# Patient Record
Sex: Female | Born: 1975 | Marital: Married | Smoking: Never smoker
Health system: Southern US, Community
[De-identification: ages and names within clinical notes are randomized; demographics above are authoritative.]

## PROBLEM LIST (undated history)

## (undated) HISTORY — PX: OTHER SURGICAL HISTORY: SHX169

## (undated) HISTORY — PX: JOINT REPLACEMENT: SHX530

---

## 2016-12-31 DIAGNOSIS — Z8619 Personal history of other infectious and parasitic diseases: Secondary | ICD-10-CM | POA: Insufficient documentation

## 2018-10-13 DIAGNOSIS — F410 Panic disorder [episodic paroxysmal anxiety] without agoraphobia: Secondary | ICD-10-CM | POA: Insufficient documentation

## 2019-11-30 LAB — HM PAP SMEAR: HM Pap smear: NEGATIVE

## 2020-05-18 ENCOUNTER — Ambulatory Visit: Payer: Self-pay

## 2020-12-19 ENCOUNTER — Other Ambulatory Visit: Payer: Self-pay

## 2020-12-19 ENCOUNTER — Encounter: Payer: Self-pay | Admitting: Family Medicine

## 2020-12-19 ENCOUNTER — Ambulatory Visit (INDEPENDENT_AMBULATORY_CARE_PROVIDER_SITE_OTHER): Payer: 59

## 2020-12-19 ENCOUNTER — Ambulatory Visit: Payer: 59 | Admitting: Family Medicine

## 2020-12-19 VITALS — BP 119/73 | HR 83 | Temp 98.3°F | Ht 63.39 in | Wt 162.7 lb

## 2020-12-19 DIAGNOSIS — R0600 Dyspnea, unspecified: Secondary | ICD-10-CM

## 2020-12-19 DIAGNOSIS — R002 Palpitations: Secondary | ICD-10-CM

## 2020-12-19 NOTE — Patient Instructions (Addendum)
Great to meet you today! Have labs and chest xray completed.  We'll be in touch with results and recommendations from there.

## 2020-12-20 DIAGNOSIS — R002 Palpitations: Secondary | ICD-10-CM | POA: Insufficient documentation

## 2020-12-20 LAB — CBC
HCT: 42.1 % (ref 35.0–45.0)
Hemoglobin: 14.6 g/dL (ref 11.7–15.5)
MCH: 32.2 pg (ref 27.0–33.0)
MCHC: 34.7 g/dL (ref 32.0–36.0)
MCV: 92.7 fL (ref 80.0–100.0)
MPV: 10.9 fL (ref 7.5–12.5)
Platelets: 317 10*3/uL (ref 140–400)
RBC: 4.54 10*6/uL (ref 3.80–5.10)
RDW: 13.1 % (ref 11.0–15.0)
WBC: 8.7 10*3/uL (ref 3.8–10.8)

## 2020-12-20 LAB — COMPLETE METABOLIC PANEL WITH GFR
AG Ratio: 1.7 (calc) (ref 1.0–2.5)
ALT: 13 U/L (ref 6–29)
AST: 15 U/L (ref 10–35)
Albumin: 4.3 g/dL (ref 3.6–5.1)
Alkaline phosphatase (APISO): 56 U/L (ref 31–125)
BUN/Creatinine Ratio: 9 (calc) (ref 6–22)
BUN: 6 mg/dL — ABNORMAL LOW (ref 7–25)
CO2: 24 mmol/L (ref 20–32)
Calcium: 9.1 mg/dL (ref 8.6–10.2)
Chloride: 107 mmol/L (ref 98–110)
Creat: 0.68 mg/dL (ref 0.50–1.10)
GFR, Est African American: 122 mL/min/{1.73_m2} (ref >=60)
GFR, Est Non African American: 106 mL/min/{1.73_m2} (ref >=60)
Globulin: 2.5 g/dL (calc) (ref 1.9–3.7)
Glucose, Bld: 80 mg/dL (ref 65–99)
Potassium: 4 mmol/L (ref 3.5–5.3)
Sodium: 140 mmol/L (ref 135–146)
Total Bilirubin: 0.3 mg/dL (ref 0.2–1.2)
Total Protein: 6.8 g/dL (ref 6.1–8.1)

## 2020-12-20 LAB — TSH: TSH: 1.31 mIU/L

## 2020-12-20 NOTE — Assessment & Plan Note (Signed)
EKG normal.  Check CMP, CBC and TSH.  She has had some dyspnea as well.  CXR ordered.  Discussed that anxiety may be contributing which she realizes as well but wants to be sure there is no other serious underlying cause.  Can consider holter and/or echo if symptoms persist.

## 2020-12-20 NOTE — Progress Notes (Signed)
Michelle Chambers - 45 y.o. female MRN [id number]  Date of birth: [birth year]  Subjective Chief Complaint  Patient presents with  . Establish Care  . Chest Pain  . Tachycardia    HPI Michelle Chambers is a 45 y.o. female here today for initial visit to establish care.  She has been in good health but has concerns about episodes of palpitations with associated shortness of breath.  Symptoms seem to occur randomly.  These do worsen some with exertional activity.  She denies fever, chills, cough, wheezing, chest pain, dizziness.  She does admit to some mild anxiety but doesn't think that this is a major contributor to her symptoms.    ROS:  A comprehensive ROS was completed and negative except as noted per HPI  No Known Allergies  History reviewed. No pertinent past medical history.  Past Surgical History:  Procedure Laterality Date  . BROKEN BONE REPLACEMENT    . JOINT REPLACEMENT      Social History   Socioeconomic History  . Marital status: Married    Spouse name: Not on file  . Number of children: Not on file  . Years of education: Not on file  . Highest education level: Not on file  Occupational History  . Not on file  Tobacco Use  . Smoking status: Never Smoker  . Smokeless tobacco: Never Used  Vaping Use  . Vaping Use: Never used  Substance and Sexual Activity  . Alcohol use: Never  . Drug use: Never  . Sexual activity: Yes  Other Topics Concern  . Not on file  Social History Narrative  . Not on file   Social Determinants of Health   Financial Resource Strain: Not on file  Food Insecurity: Not on file  Transportation Needs: Not on file  Physical Activity: Not on file  Stress: Not on file  Social Connections: Not on file    Family History  Problem Relation Age of Onset  . Colon cancer Maternal Grandfather     Health Maintenance  Topic Date Due  . Hepatitis C Screening  Never done  . HIV Screening  Never done  . PAP SMEAR-Modifier  Never done  .  COLONOSCOPY (Pts 45-39yrs Insurance coverage will need to be confirmed)  Never done  . COVID-19 Vaccine (3 - Booster) 12/04/2020  . TETANUS/TDAP  10/29/2024  . INFLUENZA VACCINE  Discontinued     ----------------------------------------------------------------------------------------------------------------------------------------------------------------------------------------------------------------- Physical Exam BP 119/73 (BP Location: Left Arm, Patient Position: Sitting, Cuff Size: Normal)   Pulse 83   Temp 98.3 F (36.8 C)   Ht 5' 3.39" (1.61 m)   Wt 162 lb 11.2 oz (73.8 kg)   LMP 12/08/2020 (Exact Date)   SpO2 98%   BMI 28.47 kg/m   Physical Exam Constitutional:      Appearance: She is well-developed.  HENT:     Head: Normocephalic and atraumatic.  Eyes:     General: No scleral icterus. Cardiovascular:     Rate and Rhythm: Normal rate and regular rhythm.  Pulmonary:     Effort: Pulmonary effort is normal.     Breath sounds: Normal breath sounds.  Musculoskeletal:     Cervical back: Normal range of motion and neck supple.  Neurological:     General: No focal deficit present.     Mental Status: She is alert.  Psychiatric:        Mood and Affect: Mood normal.        Behavior: Behavior normal.    EKG:  NSR  with borderline PR interval.  QTc normal.  ------------------------------------------------------------------------------------------------------------------------------------------------------------------------------------------------------------------- Assessment and Plan  Palpitations EKG normal.  Check CMP, CBC and TSH.  She has had some dyspnea as well.  CXR ordered.  Discussed that anxiety may be contributing which she realizes as well but wants to be sure there is no other serious underlying cause.  Can consider holter and/or echo if symptoms persist.     No orders of the defined types were placed in this encounter.   No follow-ups on  file.    This visit occurred during the SARS-CoV-2 public health emergency.  Safety protocols were in place, including screening questions prior to the visit, additional usage of staff PPE, and extensive cleaning of exam room while observing appropriate contact time as indicated for disinfecting solutions.

## 2020-12-21 ENCOUNTER — Ambulatory Visit: Payer: Self-pay | Admitting: Medical-Surgical

## 2020-12-27 ENCOUNTER — Encounter: Payer: Self-pay | Admitting: Family Medicine

## 2021-04-03 ENCOUNTER — Ambulatory Visit (INDEPENDENT_AMBULATORY_CARE_PROVIDER_SITE_OTHER): Payer: 59

## 2021-04-03 ENCOUNTER — Encounter: Payer: Self-pay | Admitting: Family Medicine

## 2021-04-03 ENCOUNTER — Other Ambulatory Visit: Payer: Self-pay

## 2021-04-03 ENCOUNTER — Ambulatory Visit (INDEPENDENT_AMBULATORY_CARE_PROVIDER_SITE_OTHER): Payer: 59 | Admitting: Family Medicine

## 2021-04-03 VITALS — BP 128/83 | HR 88 | Temp 98.0°F | Ht 63.0 in | Wt 157.0 lb

## 2021-04-03 DIAGNOSIS — Z8619 Personal history of other infectious and parasitic diseases: Secondary | ICD-10-CM | POA: Diagnosis not present

## 2021-04-03 DIAGNOSIS — M79645 Pain in left finger(s): Secondary | ICD-10-CM

## 2021-04-03 DIAGNOSIS — F410 Panic disorder [episodic paroxysmal anxiety] without agoraphobia: Secondary | ICD-10-CM

## 2021-04-03 DIAGNOSIS — F411 Generalized anxiety disorder: Secondary | ICD-10-CM

## 2021-04-03 DIAGNOSIS — M79671 Pain in right foot: Secondary | ICD-10-CM | POA: Diagnosis not present

## 2021-04-03 MED ORDER — ESCITALOPRAM OXALATE 10 MG PO TABS: 10.0000 mg | ORAL_TABLET | Freq: Every day | ORAL | 3 refills | Status: AC

## 2021-04-03 MED ORDER — VALTREX 500 MG PO TABS: 500.0000 mg | ORAL_TABLET | Freq: Every day | ORAL | 2 refills | Status: DC

## 2021-04-03 MED ORDER — VALACYCLOVIR HCL 500 MG PO TABS: 500.0000 mg | ORAL_TABLET | Freq: Every day | ORAL | 2 refills | Status: DC

## 2021-04-03 NOTE — Assessment & Plan Note (Signed)
Starting lexapro 5mg  x 1 week with increase to 10mg  thereafter.  Recommend referral to therapist but would like to hold off on this for now.

## 2021-04-03 NOTE — Assessment & Plan Note (Signed)
Xray of fingers ordered.

## 2021-04-03 NOTE — Progress Notes (Signed)
Michelle Chambers - 45 y.o. female MRN [id number]  Date of birth: Nov 02, 1975  Subjective Chief Complaint  Patient presents with  . Anxiety  . Foot Injury  . Hand Pain    HPI Michelle Chambers is 45 y.o. female here today with complaint of anxiety, finger pain and foot issue.   Thinks she may have foreign body in her foot.  She has had tender, callused are along mid food.  Tender to pressure to this are.  She has had this for about 15 years.  Doesn't remember any specific foreign body.  She has had her husband try to "dig this out"  In the past but hasn't found anything.  She denies swelling, redness, or drainage.   Having pain in proximal phalanx of L hand.  No joint swelling or injury.  No redness or warmth.  She reports increased feeling of anxiety as well. She has taken lexapro in the past and feels like she did pretty well with this.  Would be interested in re-starting this.   Depression screen [facility] 2/9 04/03/2021 12/19/2020  Decreased Interest 0 0  Down, Depressed, Hopeless 0 0  PHQ - 2 Score 0 0  Altered sleeping 0 0  Tired, decreased energy 3 0  Change in appetite 0 0  Feeling bad or failure about yourself  0 0  Trouble concentrating 0 0  Moving slowly or fidgety/restless 0 0  Suicidal thoughts 0 0  PHQ-9 Score 3 0  Difficult doing work/chores Not difficult at all -   GAD 7 : Generalized Anxiety Score 04/03/2021 12/19/2020  Nervous, Anxious, on Edge 3 2  Control/stop worrying 1 2  Worry too much - different things 3 2  Trouble relaxing 0 2  Restless 0 2  Easily annoyed or irritable 0 2  Afraid - awful might happen 3 2  Total GAD 7 Score 10 14  Anxiety Difficulty Somewhat difficult Somewhat difficult   ROS:  A comprehensive ROS was completed and negative except as noted per HPI  No Known Allergies  History reviewed. No pertinent past medical history.  Past Surgical History:  Procedure Laterality Date  . BROKEN BONE REPLACEMENT    . JOINT REPLACEMENT      Social  History   Socioeconomic History  . Marital status: Married    Spouse name: Not on file  . Number of children: Not on file  . Years of education: Not on file  . Highest education level: Not on file  Occupational History  . Not on file  Tobacco Use  . Smoking status: Never Smoker  . Smokeless tobacco: Never Used  Vaping Use  . Vaping Use: Never used  Substance and Sexual Activity  . Alcohol use: Never  . Drug use: Never  . Sexual activity: Yes  Other Topics Concern  . Not on file  Social History Narrative  . Not on file   Social Determinants of Health   Financial Resource Strain: Not on file  Food Insecurity: Not on file  Transportation Needs: Not on file  Physical Activity: Not on file  Stress: Not on file  Social Connections: Not on file    Family History  Problem Relation Age of Onset  . Colon cancer Maternal Grandfather     Health Maintenance  Topic Date Due  . HIV Screening  Never done  . Hepatitis C Screening  Never done  . COLONOSCOPY (Pts 45-107yrs Insurance coverage will need to be confirmed)  Never done  . COVID-19 Vaccine (3 -  Booster) 11/03/2020  . PAP SMEAR-Modifier  11/29/2022  . TETANUS/TDAP  10/29/2024  . Zoster Vaccines- Shingrix (1 of 2) 02/15/2025  . Pneumococcal Vaccine 69-15 Years old  Aged Out  . HPV VACCINES  Aged Out  . INFLUENZA VACCINE  Discontinued     ----------------------------------------------------------------------------------------------------------------------------------------------------------------------------------------------------------------- Physical Exam BP 128/83 (BP Location: Left Arm, Patient Position: Sitting, Cuff Size: Normal)   Pulse 88   Temp 98 F (36.7 C)   Ht 5\' 3"  (1.6 m)   Wt 157 lb (71.2 kg)   LMP 03/17/2021   SpO2 96%   BMI 27.81 kg/m   Physical Exam Constitutional:      Appearance: Normal appearance.  Eyes:     General: No scleral icterus. Cardiovascular:     Rate and Rhythm: Normal rate  and regular rhythm.  Pulmonary:     Effort: Pulmonary effort is normal.     Breath sounds: Normal breath sounds.  Musculoskeletal:     Cervical back: Neck supple.     Comments: Pain along proximal phalanx of L hand without swelling or erythema.   Skin:    General: Skin is warm and dry.     Comments: Callused area along plantar aspect of R foot.  TTP with pressure.  No foreign body visualized on exam.    Neurological:     General: No focal deficit present.     Mental Status: She is alert.  Psychiatric:        Mood and Affect: Mood normal.        Behavior: Behavior normal.     ------------------------------------------------------------------------------------------------------------------------------------------------------------------------------------------------------------------- Assessment and Plan  Generalized anxiety disorder with panic attacks Starting lexapro 5mg  x 1 week with increase to 10mg  thereafter.  Recommend referral to therapist but would like to hold off on this for now.    History of herpes simplex infection Daily valtrex renewed.   Right foot pain Appears to be plantar wart.  Will obtain xray to evaluate for radiopaque foreign body but I think this cause is less likely.   Finger pain, left Xray of fingers ordered.    Meds ordered this encounter  Medications  . escitalopram (LEXAPRO) 10 MG tablet    Sig: Take 1 tablet (10 mg total) by mouth daily. Take 5mg  daily x1 week then increase to 10mg  daily    Dispense:  30 tablet    Refill:  3  . DISCONTD: valACYclovir (VALTREX) 500 MG tablet    Sig: Take 1 tablet (500 mg total) by mouth daily.    Dispense:  90 tablet    Refill:  2  . VALTREX 500 MG tablet    Sig: Take 1 tablet (500 mg total) by mouth daily. Replaces previous rx sent for valacyclovir.    Dispense:  90 tablet    Refill:  2    No follow-ups on file.    This visit occurred during the SARS-CoV-2 public health emergency.  Safety protocols  were in place, including screening questions prior to the visit, additional usage of staff PPE, and extensive cleaning of exam room while observing appropriate contact time as indicated for disinfecting solutions.

## 2021-04-03 NOTE — Assessment & Plan Note (Signed)
Appears to be plantar wart.  Will obtain xray to evaluate for radiopaque foreign body but I think this cause is less likely.

## 2021-04-03 NOTE — Assessment & Plan Note (Signed)
Daily valtrex renewed.

## 2021-04-03 NOTE — Patient Instructions (Signed)
Plantar Warts Plantar warts are small growths on the bottom of the foot (sole). Warts are caused by a type of germ (virus). Most warts are not painful, and they usually do not cause problems. Sometimes, plantar warts can cause pain when you walk. Warts often go away on their own in time. They can also spread to other areas of the body. Treatments may be done if needed. What are the causes?  Plantar warts are caused by a germ that is called human papillomavirus (HPV). ? Walking barefoot can cause exposure to the germ, especially if your feet are wet. ? Warts happen when HPV attacks a break in the skin of the foot. What increases the risk?  Being between 49-7 years of age.  Using public showers or locker rooms.  Having a weakened body defense system (immune system). What are the signs or symptoms?  Flat or slightly raised growths that have a rough surface and look like a callus.  Pain when you use your foot to support your body weight.   How is this treated? In many cases, warts do not need treatment. Without treatment, they often go away with time. If treatment is needed or wanted, options may include:  Applying medicated solutions, creams, or patches to the wart. These make the skin soft so that layers will slowly shed away.  Freezing the wart with liquid nitrogen (cryotherapy).  Burning the wart with: ? Laser treatment. ? An electrified probe (electrocautery).  Injecting a medicine (Candida antigen) into the wart to help the body's defense system fight off the wart.  Having surgery to remove the wart.  Putting duct tape over the top of the wart (occlusion). You will leave the tape in place for as long as told by your doctor. Then you will replace it with a new strip of tape. This is done until the wart goes away. Repeat treatment may be needed if you choose to remove warts. Warts sometimes go away and come back again. Follow these instructions at home: General  instructions  Apply creams or solutions only as told by your doctor. Follow these steps if your doctor tells you to do so: ? Soak your foot in warm water. ? Remove the top layer of softened skin before you apply the medicine. You can use a pumice stone to remove the skin. ? After you apply the medicine, put a bandage over the area of the wart. ? Repeat the process every day or as told by your doctor.  Do not scratch or pick at a wart.  Wash your hands after you touch a wart.  If a wart hurts, try covering it with a bandage that has a hole in the middle.  Keep all follow-up visits as told by your doctor. This is important. How is this prevented?  Wear shoes and socks. Change your socks every day.  Keep your feet clean and dry.  Check your feet often.  Do not walk barefoot in: ? Shared locker rooms. ? Shower areas. ? Swimming pools.  Avoid direct contact with warts on other people.   Contact a doctor if:  Your warts do not improve after treatment.  You have redness, swelling, or pain at the site of a wart.  You have bleeding from a wart, and the bleeding does not stop when you put light pressure on the wart.  You have diabetes and you get a wart. Summary  Warts are small growths on the skin.  When warts happen on the  bottom of the foot (sole), they are called plantar warts.  In many cases, warts do not need treatment.  Apply creams or solutions only as told by your doctor.  Do not scratch or pick at a wart. Wash your hands after you touch a wart. This information is not intended to replace advice given to you by your health care provider. Make sure you discuss any questions you have with your health care provider. Document Revised: 07/24/2018 Document Reviewed: 07/24/2018 Elsevier Patient Education  2021 [employer].

## 2021-04-04 ENCOUNTER — Other Ambulatory Visit: Payer: Self-pay | Admitting: Family Medicine

## 2021-04-04 MED ORDER — VALACYCLOVIR HCL 500 MG PO TABS: 500.0000 mg | ORAL_TABLET | Freq: Every day | ORAL | 2 refills | Status: AC

## 2021-04-12 ENCOUNTER — Other Ambulatory Visit: Payer: Self-pay | Admitting: Family Medicine

## 2021-04-12 DIAGNOSIS — R079 Chest pain, unspecified: Secondary | ICD-10-CM

## 2021-04-16 ENCOUNTER — Emergency Department: Admit: 2021-04-16 | Discharge status: Against Medical Advice | Payer: Self-pay

## 2021-04-17 ENCOUNTER — Other Ambulatory Visit: Payer: Self-pay | Admitting: Family Medicine

## 2021-04-17 ENCOUNTER — Telehealth: Payer: Self-pay

## 2021-04-17 DIAGNOSIS — R1319 Other dysphagia: Secondary | ICD-10-CM

## 2021-04-17 NOTE — Telephone Encounter (Signed)
Order entered

## 2021-04-17 NOTE — Telephone Encounter (Signed)
Pt has been advised of Urgent referral placement.

## 2021-04-17 NOTE — Telephone Encounter (Signed)
Pt lvm requesting Urgent Referral to [facility]. States she needs an EGD and throat stretching.   Dr. Ashley Royalty has been advised.

## 2021-04-18 ENCOUNTER — Telehealth: Payer: Self-pay | Admitting: General Practice

## 2021-04-18 ENCOUNTER — Telehealth: Payer: Self-pay | Admitting: Gastroenterology

## 2021-04-18 NOTE — Telephone Encounter (Signed)
Please go ahead and schedule in APP clinic RG

## 2021-04-18 NOTE — Telephone Encounter (Signed)
Transition Care Management Follow-up Telephone Call Date of discharge and from where: 04/17/21 from [facility] How have you been since you were released from the hospital? Doing ok. Any questions or concerns? No  Items Reviewed: Did the pt receive and understand the discharge instructions provided? Yes  Medications obtained and verified? Yes  Other? No  Any new allergies since your discharge? No  Dietary orders reviewed? Yes Do you have support at home? Yes   Home Care and Equipment/Supplies: Were home health services ordered? no   Functional Questionnaire: (I = Independent and D = Dependent) ADLs: I  Bathing/Dressing- I  Meal Prep- I  Eating- I  Maintaining continence- I  Transferring/Ambulation- I  Managing Meds- I  Follow up appointments reviewed:  PCP Hospital f/u appt confirmed? No   Specialist Hospital f/u appt confirmed? Yes  Patient has been referred to GI doctor. She stated that they have contacted her but she is waiting to hear back with an appointment.  Are transportation arrangements needed? No  If their condition worsens, is the pt aware to call PCP or go to the Emergency Dept.? Yes Was the patient provided with contact information for the PCP's office or ED? Yes Was to pt encouraged to call back with questions or concerns? Yes

## 2021-04-18 NOTE — Telephone Encounter (Signed)
Hey Dr Chales Abrahams, this pt is being referred to [place] from [facility] for Esophageal Dysphagia but it looks like the pt has been seen by Dr Donivan Scull [facility] this year 2022, records are in epic for review, please advise on scheduling.

## 2021-04-20 NOTE — Telephone Encounter (Signed)
Patient called to schedule with an APP for 04/26/21 appointment was no longer available offered her the next available but she declined and requested to speak with you.

## 2021-04-20 NOTE — Telephone Encounter (Signed)
Pls call pt, she declined first available appt stating that the person that she spoke with before told her that we will get her in within one week in a urgent appointment opening.

## 2021-05-08 ENCOUNTER — Ambulatory Visit: Payer: 59 | Admitting: Family Medicine

## 2021-05-10 ENCOUNTER — Encounter: Payer: Self-pay | Admitting: Family Medicine

## 2021-05-11 ENCOUNTER — Other Ambulatory Visit: Payer: Self-pay | Admitting: Family Medicine

## 2021-05-11 DIAGNOSIS — Z124 Encounter for screening for malignant neoplasm of cervix: Secondary | ICD-10-CM

## 2021-05-11 NOTE — Telephone Encounter (Signed)
Referral entered  

## 2021-05-15 ENCOUNTER — Ambulatory Visit: Payer: 59 | Admitting: Family Medicine

## 2021-06-05 ENCOUNTER — Encounter: Payer: 59 | Admitting: Obstetrics & Gynecology

## 2021-06-05 ENCOUNTER — Ambulatory Visit: Payer: 59 | Admitting: Cardiology

## 2021-07-04 ENCOUNTER — Encounter: Payer: Self-pay | Admitting: Family Medicine

## 2021-07-04 ENCOUNTER — Ambulatory Visit: Payer: 59 | Admitting: Medical-Surgical

## 2021-07-04 ENCOUNTER — Telehealth: Payer: Self-pay

## 2021-07-04 NOTE — Telephone Encounter (Signed)
Appt cancelled.  Tiajuana Amass, CMA

## 2021-07-04 NOTE — Telephone Encounter (Signed)
Michelle Chambers called and complained of back pain. She states she has a UTI. I suggested we get her on the schedule. She was upset with this suggestion. She states she only wants to due a urine culture. She states Dr Ashley Royalty has agreed to this in the past. Please advise.

## 2021-07-04 NOTE — Telephone Encounter (Signed)
Panya discussed with pt via telephone.  Tiajuana Amass, CMA

## 2021-07-04 NOTE — Telephone Encounter (Signed)
She can drop off a urine specimen. We'll need to add her for a virtual visit at the end of the day so I can go over with her.

## 2022-03-02 IMAGING — DX DG CHEST 2V
2 series · 2 of 2 positions shown · non-contrast
Comparison: None.

CLINICAL DATA: Dyspnea, palpitations

EXAM:
CHEST - 2 VIEW

[chest pa]
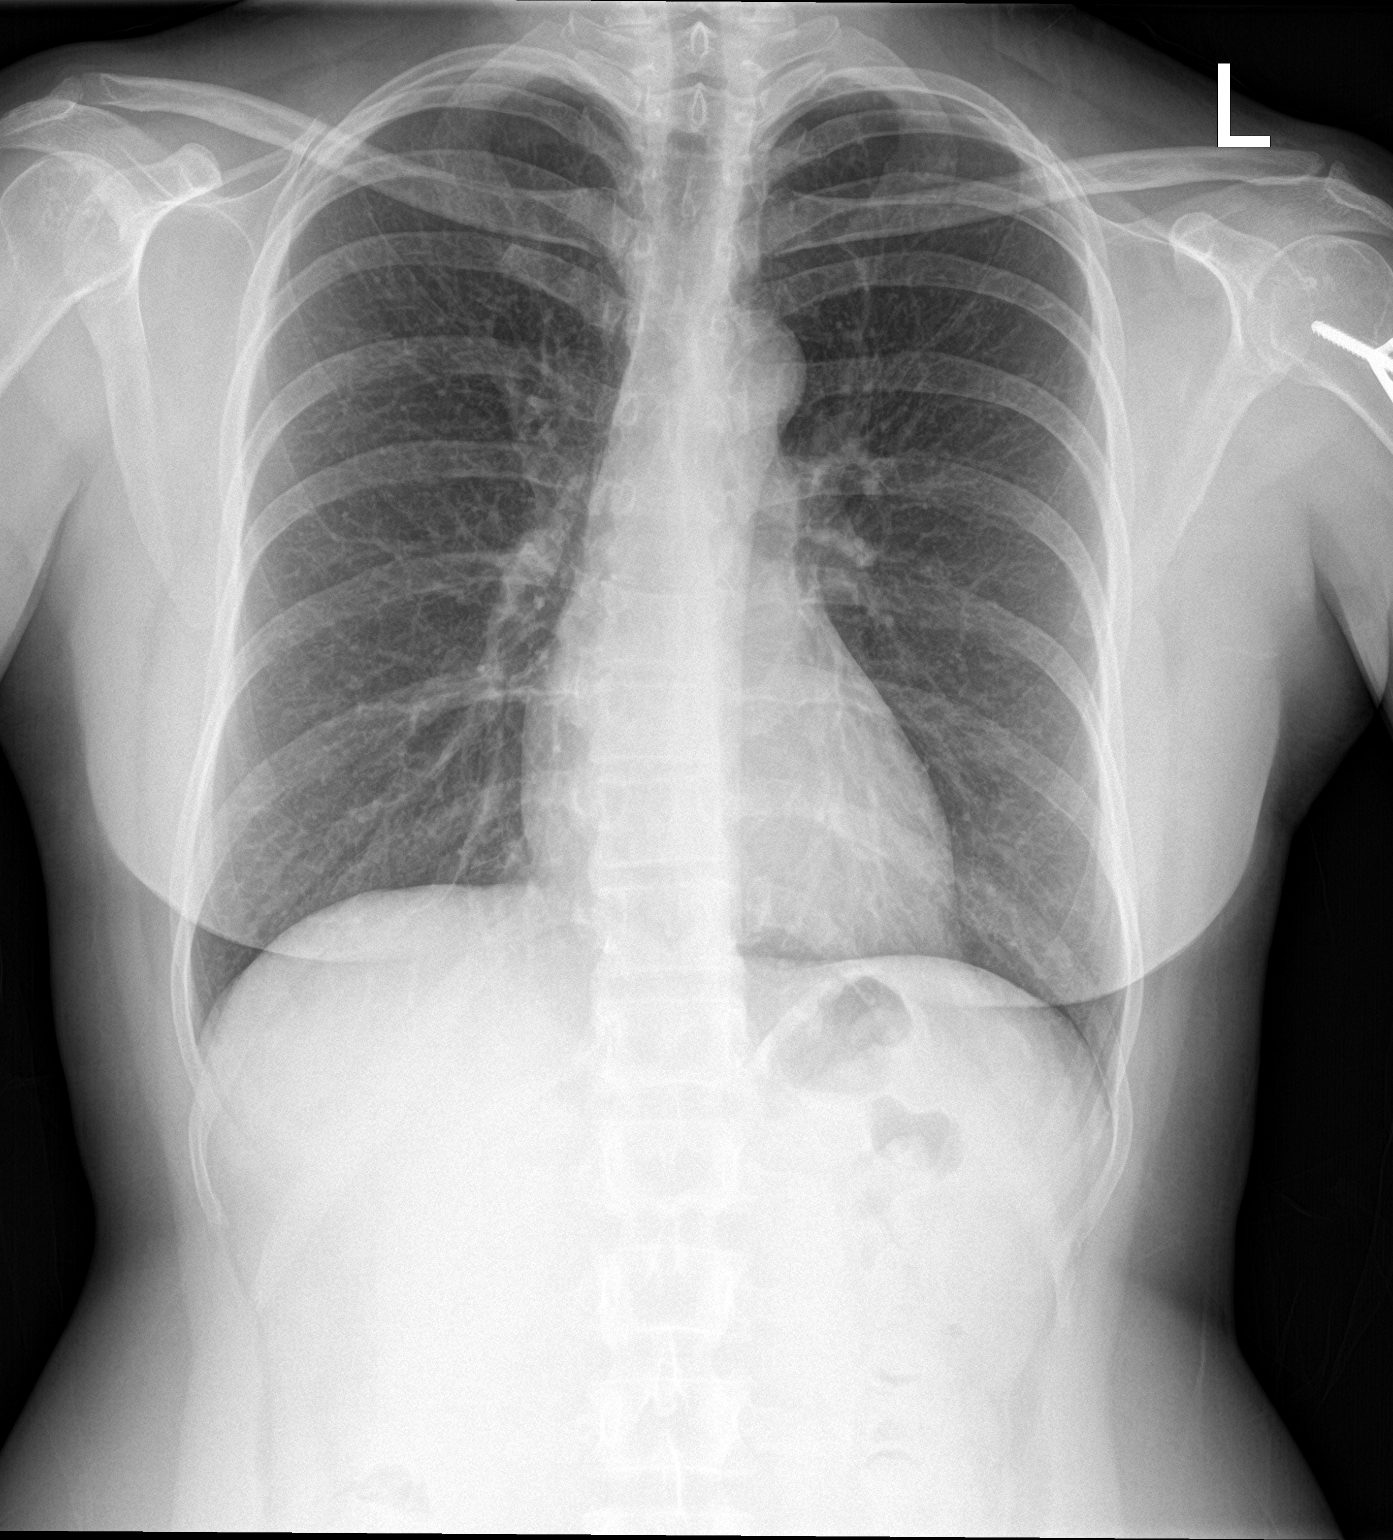

[chest lat]
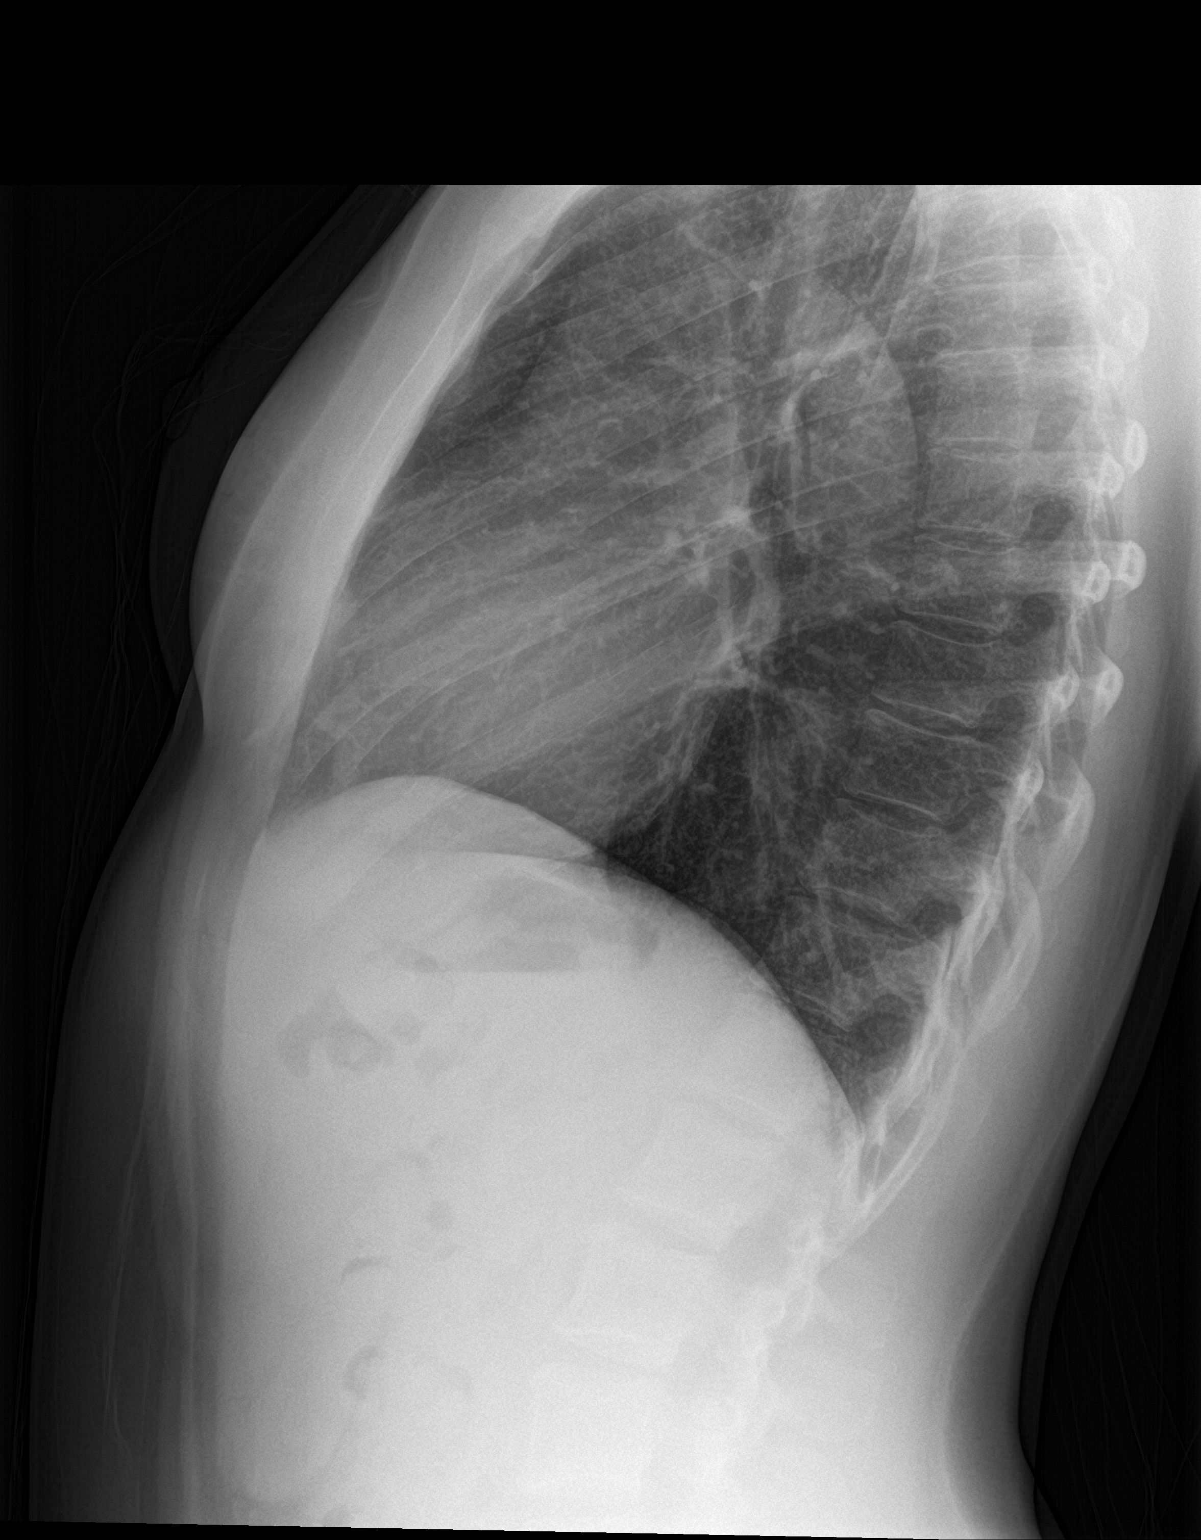

[2 of 2 positions shown; findings below may reference images not displayed]

FINDINGS: The heart size and mediastinal contours are within normal limits.
Both lungs are clear. The visualized skeletal structures are
unremarkable.
IMPRESSION: No active cardiopulmonary disease.
